# Patient Record
Sex: Male | Born: 1980 | Hispanic: No | Marital: Married
Health system: Southern US, Community
[De-identification: ages and names within clinical notes are randomized; demographics above are authoritative.]

## PROBLEM LIST (undated history)

## (undated) ENCOUNTER — Emergency Department (HOSPITAL_COMMUNITY): Payer: Self-pay

## (undated) DIAGNOSIS — J40 Bronchitis, not specified as acute or chronic: Secondary | ICD-10-CM

---

## 2016-06-07 ENCOUNTER — Emergency Department (HOSPITAL_COMMUNITY)
Admission: EM | Admit: 2016-06-07 | Discharge: 2016-06-07 | Disposition: A | Discharge status: Home / Self Care | Payer: Self-pay | Attending: Emergency Medicine | Admitting: Emergency Medicine

## 2016-06-07 ENCOUNTER — Emergency Department (HOSPITAL_COMMUNITY): Payer: Self-pay

## 2016-06-07 ENCOUNTER — Encounter (HOSPITAL_COMMUNITY): Payer: Self-pay | Admitting: Emergency Medicine

## 2016-06-07 DIAGNOSIS — Y92512 Supermarket, store or market as the place of occurrence of the external cause: Secondary | ICD-10-CM | POA: Insufficient documentation

## 2016-06-07 DIAGNOSIS — Y939 Activity, unspecified: Secondary | ICD-10-CM | POA: Insufficient documentation

## 2016-06-07 DIAGNOSIS — Y990 Civilian activity done for income or pay: Secondary | ICD-10-CM | POA: Insufficient documentation

## 2016-06-07 DIAGNOSIS — Z23 Encounter for immunization: Secondary | ICD-10-CM | POA: Insufficient documentation

## 2016-06-07 DIAGNOSIS — S61411A Laceration without foreign body of right hand, initial encounter: Secondary | ICD-10-CM | POA: Insufficient documentation

## 2016-06-07 HISTORY — DX: Bronchitis, not specified as acute or chronic: J40

## 2016-06-07 MED ORDER — LIDOCAINE-EPINEPHRINE (PF) 2 %-1:200000 IJ SOLN
20.0000 mL | Freq: Once | INTRAMUSCULAR | Status: AC
  Administered 2016-06-07: 20 mL via INTRADERMAL
  Filled 2016-06-07: qty 20

## 2016-06-07 MED ORDER — TETANUS-DIPHTH-ACELL PERTUSSIS 5-2.5-18.5 LF-MCG/0.5 IM SUSP
0.5000 mL | Freq: Once | INTRAMUSCULAR | Status: AC
  Administered 2016-06-07: 0.5 mL via INTRAMUSCULAR
  Filled 2016-06-07: qty 0.5
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
## 2016-06-07 NOTE — ED Notes (Signed)
PT DISCHARGED. INSTRUCTIONS GIVEN. AAOX4. PT IN NO APPARENT DISTRESS. THE OPPORTUNITY TO ASK QUESTIONS WAS PROVIDED. 

## 2016-06-07 NOTE — Discharge Instructions (Signed)
3 Sutures were placed Remove in one week Return if you see any signs of infection
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
## 2024-03-05 ENCOUNTER — Emergency Department (HOSPITAL_BASED_OUTPATIENT_CLINIC_OR_DEPARTMENT_OTHER)
Admission: EM | Admit: 2024-03-05 | Discharge: 2024-03-05 | Disposition: A | Discharge status: Home / Self Care | Payer: Self-pay | Attending: Emergency Medicine | Admitting: Emergency Medicine

## 2024-03-05 ENCOUNTER — Other Ambulatory Visit: Payer: Self-pay

## 2024-03-05 DIAGNOSIS — Y92814 Boat as the place of occurrence of the external cause: Secondary | ICD-10-CM | POA: Insufficient documentation

## 2024-03-05 DIAGNOSIS — W500XXA Accidental hit or strike by another person, initial encounter: Secondary | ICD-10-CM | POA: Insufficient documentation

## 2024-03-05 DIAGNOSIS — S01511A Laceration without foreign body of lip, initial encounter: Secondary | ICD-10-CM | POA: Insufficient documentation

## 2024-03-05 DIAGNOSIS — S0181XA Laceration without foreign body of other part of head, initial encounter: Secondary | ICD-10-CM

## 2024-03-05 MED ORDER — LIDOCAINE HCL (PF) 1 % IJ SOLN
10.0000 mL | Freq: Once | INTRAMUSCULAR | Status: AC
  Administered 2024-03-05: 10 mL
  Filled 2024-03-05: qty 10
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
## 2024-03-05 NOTE — ED Notes (Signed)
 AVS provided by edp was reviewed with the pt. Pt verbalized understanding with no additional questions at this time. Pt to go home with s/o at bedside

## 2024-03-05 NOTE — Discharge Instructions (Addendum)
 As discussed, the inner aspect of your lip was repaired using absorbable stitches.  These should dissolve within the next 10 days or so.  The external lip was repaired using nonabsorbable stitches.  These should be removed within the next 7 days.  Clean area gently with warm soapy water.  Return if you develop any concerning symptom of infection such as fever, redness, pus drainage or other abnormality.  You may go to urgent care, primary care or back in the emergency department to have the sutures taken out.
